# Patient Record
Sex: Female | Born: 1995 | Hispanic: Yes | Marital: Single | Smoking: Never smoker
Health system: Southern US, Community
[De-identification: ages and names within clinical notes are randomized; demographics above are authoritative.]

---

## 2016-12-17 ENCOUNTER — Encounter (HOSPITAL_COMMUNITY): Payer: Self-pay | Admitting: Emergency Medicine

## 2016-12-17 ENCOUNTER — Emergency Department (HOSPITAL_COMMUNITY)
Admission: EM | Admit: 2016-12-17 | Discharge: 2016-12-18 | Disposition: A | Discharge status: Home / Self Care | Payer: BC Managed Care – PPO | Attending: Emergency Medicine | Admitting: Emergency Medicine

## 2016-12-17 DIAGNOSIS — R509 Fever, unspecified: Secondary | ICD-10-CM | POA: Insufficient documentation

## 2016-12-17 DIAGNOSIS — R000 Tachycardia, unspecified: Secondary | ICD-10-CM | POA: Diagnosis not present

## 2016-12-17 DIAGNOSIS — R0981 Nasal congestion: Secondary | ICD-10-CM | POA: Diagnosis not present

## 2016-12-17 DIAGNOSIS — R590 Localized enlarged lymph nodes: Secondary | ICD-10-CM | POA: Insufficient documentation

## 2016-12-17 DIAGNOSIS — J36 Peritonsillar abscess: Secondary | ICD-10-CM | POA: Diagnosis not present

## 2016-12-17 DIAGNOSIS — R51 Headache: Secondary | ICD-10-CM | POA: Diagnosis not present

## 2016-12-17 DIAGNOSIS — J029 Acute pharyngitis, unspecified: Secondary | ICD-10-CM | POA: Diagnosis present

## 2016-12-17 MED ORDER — DEXAMETHASONE SODIUM PHOSPHATE 10 MG/ML IJ SOLN
10.0000 mg | Freq: Once | INTRAMUSCULAR | Status: AC
  Administered 2016-12-18: 10 mg via INTRAVENOUS
  Filled 2016-12-17: qty 1

## 2016-12-17 MED ORDER — PENICILLIN G BENZATHINE 1200000 UNIT/2ML IM SUSP
1.2000 10*6.[IU] | Freq: Once | INTRAMUSCULAR | Status: AC
  Administered 2016-12-17: 1.2 10*6.[IU] via INTRAMUSCULAR
  Filled 2016-12-17: qty 2

## 2016-12-17 MED ORDER — LACTATED RINGERS IV BOLUS (SEPSIS)
1000.0000 mL | Freq: Once | INTRAVENOUS | Status: AC
  Administered 2016-12-18: 1000 mL via INTRAVENOUS

## 2016-12-17 MED ORDER — ACETAMINOPHEN-CODEINE 120-12 MG/5ML PO SOLN
5.0000 mL | Freq: Once | ORAL | Status: AC
  Administered 2016-12-17: 5 mL via ORAL

## 2016-12-17 MED ORDER — KETOROLAC TROMETHAMINE 15 MG/ML IJ SOLN
15.0000 mg | Freq: Once | INTRAMUSCULAR | Status: AC
  Administered 2016-12-18: 15 mg via INTRAVENOUS
  Filled 2016-12-17: qty 1

## 2016-12-17 MED ORDER — LIDOCAINE VISCOUS 2 % MT SOLN
15.0000 mL | Freq: Once | OROMUCOSAL | Status: AC
  Administered 2016-12-17: 15 mL via OROMUCOSAL
  Filled 2016-12-17: qty 15

## 2016-12-17 MED ORDER — DEXAMETHASONE 10 MG/ML FOR PEDIATRIC ORAL USE
10.0000 mg | Freq: Once | INTRAMUSCULAR | Status: DC
  Filled 2016-12-17 (×2): qty 1

## 2016-12-17 NOTE — ED Notes (Signed)
Patient will not open mouth for exam for NP because it is too painful.  Pt now acuity 3 per Damian Leavell and [place].

## 2016-12-17 NOTE — ED Notes (Signed)
This RN wasted 120-12 mg/107mL of acetaminophen-codeine with Marzetta Board, in sink.

## 2016-12-17 NOTE — ED Provider Notes (Signed)
[facility]-EMERGENCY DEPT Provider Note   CSN: [id number] Arrival date & time: 12/17/16  2009     History   Chief Complaint Chief Complaint  Patient presents with  . Sore Throat    HPI Michelle Li is a 21 y.o. female who presents to the ED with a sore throat. Patient reports that she was dx with strep throat and started on a Z-pak and continues to have the same pain.   HPI  History reviewed. No pertinent past medical history.  There are no active problems to display for this patient.   History reviewed. No pertinent surgical history.  OB History    No data available       Home Medications    Prior to Admission medications   Not on File    Family History No family history on file.  Social History Social History  Substance Use Topics  . Smoking status: Never Smoker  . Smokeless tobacco: Never Used  . Alcohol use No     Allergies   Patient has no known allergies.   Review of Systems Review of Systems  Constitutional: Positive for chills and fever.  HENT: Positive for congestion, sore throat and trouble swallowing.   Respiratory: Negative for cough.   Gastrointestinal:       Nausea and vomiting when trying to take the Zithromax that was prescribed by [facility].  Musculoskeletal: Positive for neck pain.  Skin: Negative for rash.  Neurological: Positive for headaches. Negative for syncope.  Hematological: Positive for adenopathy.     Physical Exam Updated Vital Signs BP 132/76 (BP Location: Left Arm)   Pulse (!) 106   Temp 98.2 F (36.8 C) (Oral)   Resp 16   Ht  (1.549 m)   Wt 54.9 kg (121 lb)   LMP 12/02/2016 (Approximate)   SpO2 97%   BMI 22.86 kg/m   Physical Exam  Constitutional: She is oriented to person, place, and time. She appears well-developed and well-nourished. No distress.  Patient spitting into a cup stating it is to hard to swallow.  HENT:  Head: Normocephalic.  Right Ear: Tympanic membrane normal.  Left Ear:  Tympanic membrane normal.  Nose: Nose normal.  Mouth/Throat: Uvula is midline. Posterior oropharyngeal edema and posterior oropharyngeal erythema present.  Tonsils enlarged, difficulty visualizing the throat due to patient's pain and stating she can not open her mouth.   Eyes: EOM are normal.  Neck: Neck supple.  Cardiovascular: Regular rhythm.  Tachycardia present.   Pulmonary/Chest: Effort normal.  Musculoskeletal: Normal range of motion.  Lymphadenopathy:    She has cervical adenopathy.  Neurological: She is alert and oriented to person, place, and time. No cranial nerve deficit.  Skin: Skin is warm and dry.  Psychiatric: She has a normal mood and affect.  Nursing note and vitals reviewed.    ED Treatments / Results  Labs (all labs ordered are listed, but only abnormal results are displayed) Labs Reviewed  CBC WITH DIFFERENTIAL/PLATELET - Abnormal; Notable for the following:       Result Value   WBC 22.2 (*)    RBC 5.20 (*)    Hemoglobin 15.1 (*)    Neutro Abs 17.3 (*)    Monocytes Absolute 2.6 (*)    All other components within normal limits  I-STAT CHEM 8, ED - Abnormal; Notable for the following:    Glucose, Bld 110 (*)    Hemoglobin 16.0 (*)    HCT 47.0 (*)    All other components  within normal limits  I-STAT BETA HCG BLOOD, ED ([facility], [facility], [facility] ONLY)   Radiology Ct Soft Tissue Neck W Contrast  Result Date: 12/18/2016 CLINICAL DATA:  Initial evaluation for acute sore throat, stridor. EXAM: CT NECK WITH CONTRAST TECHNIQUE: Multidetector CT imaging of the neck was performed using the standard protocol following the bolus administration of intravenous contrast. CONTRAST:  75mL ISOVUE-300 IOPAMIDOL (ISOVUE-300) INJECTION 61% COMPARISON:  None available. FINDINGS: Pharynx and larynx: Oral cavity within normal limits without mass lesion or loculated fluid collection. No acute abnormality about the dentition. Palatine tonsils are enlarged and hyperenhancing bilaterally, right  greater than left, consistent with acute tonsillitis. There is an irregular multi loculated collection at the level the right tonsil that measures 2.4 x 2.3 x 3.4 cm, consistent with tonsillar/ peritonsillar abscess (series 3, image 35). Additional curvilinear hypodense collection at the lateral margin of the left tonsil measures 1.4 x 0.6 x 1.3 cm, also consistent with tonsillar/ peritonsillar abscess (series 3, image 39). Associated edema within the adjacent oropharyngeal mucosa of. Induration within the right parapharyngeal fat. Nasopharynx within normal limits. Trace retropharyngeal effusion, likely reactive. No retropharyngeal abscess. Epiglottis within normal limits without evidence for acute epiglottitis. Vallecula clear. Remainder of the hypopharynx and supraglottic larynx within normal limits. True cords symmetric and normal. Subglottic airway clear. Salivary glands: Salivary glands including the parotid and submandibular glands are within normal limits. Thyroid: Thyroid normal. Lymph nodes: No pathologically enlarged lymph nodes identified within the neck. Vascular: Normal intravascular enhancement seen throughout the neck. Limited intracranial: Unremarkable. Visualized orbits: Visualized globes and orbital soft tissues within normal limits. Mastoids and visualized paranasal sinuses: Chronic right maxillary sinusitis. Minimal mucosal thickening noted within the inferior left maxillary sinus is well. Visualized paranasal sinuses otherwise clear. Mastoid air cells and middle ear cavities are clear. Skeleton: No acute osseous abnormality. No worrisome lytic or blastic osseous lesions. Upper chest: Visualized upper chest within normal limits. Visualized lungs are clear. Other: None. IMPRESSION: 1. Findings consistent with acute tonsillitis with associated bilateral tonsillar/peritonsillar abscesses as above. Collection on the right measures 2.4 x 2.3 x 3.4 cm. Collection on the left measures 1.4 x 0.6 x 1.3  cm. 2. No evidence for associated or acute epiglottitis. Electronically Signed   By: Rise Mu M.D.   On: 12/18/2016 02:11   Dr. Donnald Garre in to see the patient and discuss results of CT scan and plan.  Dr. Donnald Garre will drain the tonsillar abscess.  Patient to be moved to Pod A for procedure.   Procedures Procedures (including critical care time)  Medications Ordered in ED Medications  acetaminophen-codeine 120-12 MG/5ML solution 5 mL (5 mLs Oral Given 12/17/16 2218)  lidocaine (XYLOCAINE) 2 % viscous mouth solution 15 mL (15 mLs Mouth/Throat Given 12/17/16 2217)  penicillin g benzathine (BICILLIN LA) 1200000 UNIT/2ML injection 1.2 Million Units (1.2 Million Units Intramuscular Given 12/17/16 2218)  dexamethasone (DECADRON) injection 10 mg (10 mg Intravenous Given 12/18/16 0017)  lactated ringers bolus 1,000 mL (1,000 mLs Intravenous New Bag/Given 12/18/16 0024)  ketorolac (TORADOL) 15 MG/ML injection 15 mg (15 mg Intravenous Given 12/18/16 0021)  iopamidol (ISOVUE-300) 61 % injection 75 mL (75 mLs Intravenous Contrast Given 12/18/16 0140)     Initial Impression / Assessment and Plan / ED Course  I have reviewed the triage vital signs and the nursing notes.  Final Clinical Impressions(s) / ED Diagnoses   Final diagnoses:  Tonsillar abscess   Care turned over to Dr. Donnald Garre @ 2:30 am  New Prescriptions New  Prescriptions   No medications on file     Kerrie [place], [place] 12/18/16 [id number]    Arby Barrette, MD 12/25/16 [phone]

## 2016-12-17 NOTE — ED Triage Notes (Signed)
Patient reports persistent sore throat onset this week diagnosed with strep throat at school currently taking Azithromycin with no improvement , denies fever or chills .

## 2016-12-18 ENCOUNTER — Emergency Department (HOSPITAL_COMMUNITY): Payer: BC Managed Care – PPO

## 2016-12-18 LAB — CBC WITH DIFFERENTIAL/PLATELET
BASOS ABS: 0 10*3/uL (ref 0.0–0.1)
Basophils Relative: 0 %
EOS ABS: 0 10*3/uL (ref 0.0–0.7)
EOS PCT: 0 %
HCT: 44.8 % (ref 36.0–46.0)
Hemoglobin: 15.1 g/dL — ABNORMAL HIGH (ref 12.0–15.0)
LYMPHS PCT: 10 %
Lymphs Abs: 2.3 10*3/uL (ref 0.7–4.0)
MCH: 29 pg (ref 26.0–34.0)
MCHC: 33.7 g/dL (ref 30.0–36.0)
MCV: 86.2 fL (ref 78.0–100.0)
Monocytes Absolute: 2.6 10*3/uL — ABNORMAL HIGH (ref 0.1–1.0)
Monocytes Relative: 12 %
Neutro Abs: 17.3 10*3/uL — ABNORMAL HIGH (ref 1.7–7.7)
Neutrophils Relative %: 78 %
PLATELETS: 327 10*3/uL (ref 150–400)
RBC: 5.2 MIL/uL — AB (ref 3.87–5.11)
RDW: 14.1 % (ref 11.5–15.5)
WBC: 22.2 10*3/uL — AB (ref 4.0–10.5)

## 2016-12-18 LAB — I-STAT CHEM 8, ED
BUN: 11 mg/dL (ref 6–20)
Calcium, Ion: 1.18 mmol/L (ref 1.15–1.40)
Chloride: 107 mmol/L (ref 101–111)
Creatinine, Ser: 0.7 mg/dL (ref 0.44–1.00)
Glucose, Bld: 110 mg/dL — ABNORMAL HIGH (ref 65–99)
HEMATOCRIT: 47 % — AB (ref 36.0–46.0)
HEMOGLOBIN: 16 g/dL — AB (ref 12.0–15.0)
POTASSIUM: 3.6 mmol/L (ref 3.5–5.1)
SODIUM: 144 mmol/L (ref 135–145)
TCO2: 23 mmol/L (ref 22–32)

## 2016-12-18 LAB — I-STAT BETA HCG BLOOD, ED (MC, WL, AP ONLY): I-stat hCG, quantitative: <5 m[IU]/mL (ref <5)

## 2016-12-18 MED ORDER — IBUPROFEN 100 MG/5ML PO SUSP: 10.0000 mg/kg | Freq: Four times a day (QID) | ORAL | 0 refills | Status: AC | PRN

## 2016-12-18 MED ORDER — IBUPROFEN 100 MG/5ML PO SUSP
600.0000 mg | Freq: Once | ORAL | Status: AC
  Administered 2016-12-18: 600 mg via ORAL
  Filled 2016-12-18: qty 30

## 2016-12-18 MED ORDER — IOPAMIDOL (ISOVUE-300) INJECTION 61%
75.0000 mL | Freq: Once | INTRAVENOUS | Status: AC | PRN
  Administered 2016-12-18: 75 mL via INTRAVENOUS

## 2016-12-18 MED ORDER — LIDOCAINE HCL (PF) 1 % IJ SOLN
5.0000 mL | Freq: Once | INTRAMUSCULAR | Status: AC
  Administered 2016-12-18: 5 mL
  Filled 2016-12-18: qty 5

## 2016-12-18 MED ORDER — AMOXICILLIN-POT CLAVULANATE 400-57 MG/5ML PO SUSR: 400.0000 mg | Freq: Three times a day (TID) | ORAL | 0 refills | Status: AC

## 2016-12-18 MED ORDER — BENZOCAINE 20 % MT AERO
INHALATION_SPRAY | Freq: Once | OROMUCOSAL | Status: AC
  Administered 2016-12-18: 03:00:00 via OROMUCOSAL
  Filled 2016-12-18: qty 57

## 2016-12-18 NOTE — ED Notes (Signed)
Patient transported to CT 

## 2016-12-21 LAB — AEROBIC CULTURE  (SUPERFICIAL SPECIMEN)

## 2016-12-21 LAB — AEROBIC CULTURE W GRAM STAIN (SUPERFICIAL SPECIMEN)

## 2016-12-22 ENCOUNTER — Telehealth: Payer: Self-pay | Admitting: Emergency Medicine

## 2016-12-22 NOTE — Telephone Encounter (Signed)
Post ED Visit - Positive Culture Follow-up  Culture report reviewed by antimicrobial stewardship pharmacist:   Enzo Bi, Pharm.D.  Celedonio Miyamoto, Pharm.D., BCPS AQ-ID  Garvin Fila, Pharm.D., BCPS  Georgina Pillion, Pharm.D., BCPS  [place], [address].D., BCPS, AAHIVP  Estella Husk, Pharm.D., BCPS, AAHIVP  Lysle Pearl, PharmD, BCPS  Casilda Carls, PharmD, BCPS  Pollyann Samples, PharmD, BCPS  Positive urine culture Treated with azithromycin and amoxicillin, organism sensitive to the same and no further patient follow-up is required at this time.  Berle Mull 12/22/2016, 11:49 AM

## 2018-10-11 IMAGING — CT CT NECK W/ CM
3 of 4 series · 13 of 33 positions shown, 16 images · IV contrast (Omni 300)
Comparison: None available.

CLINICAL DATA: Initial evaluation for acute sore throat, stridor.

EXAM:
CT NECK WITH CONTRAST
TECHNIQUE: Multidetector CT imaging of the neck was performed using the
standard protocol following the bolus administration of intravenous
contrast.
CONTRAST:  75mL Q8DB5C-HEE IOPAMIDOL (Q8DB5C-HEE) INJECTION 61%

[Series 3: neck 2.0 st · axial · 0.50mm/px · z∈[-312,-168]mm · 5 of 109 slices shown, 7 images (1 of 3)]
[im 19/109  soft-tissue]
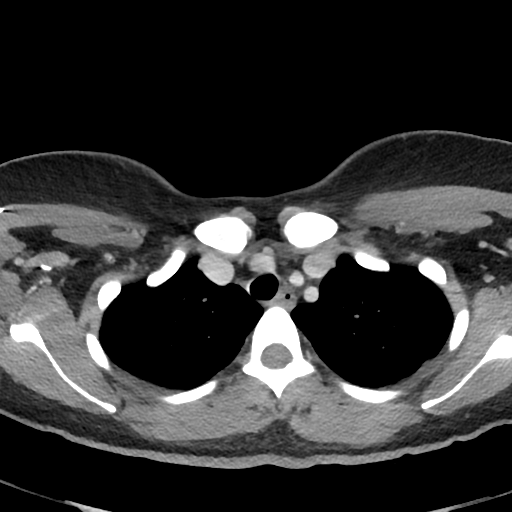
[im 19/109  bone]
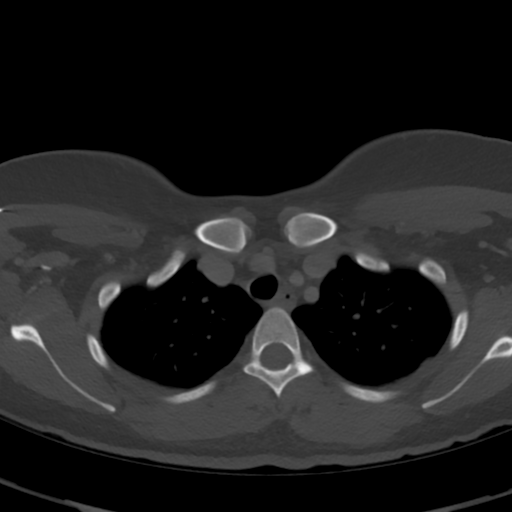
[im 37/109  bone]
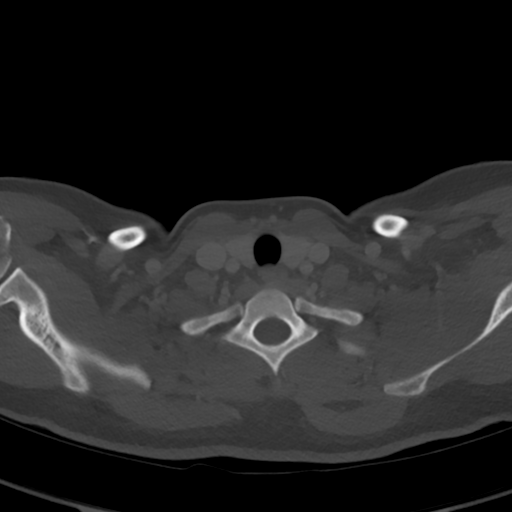
[im 55/109  bone]
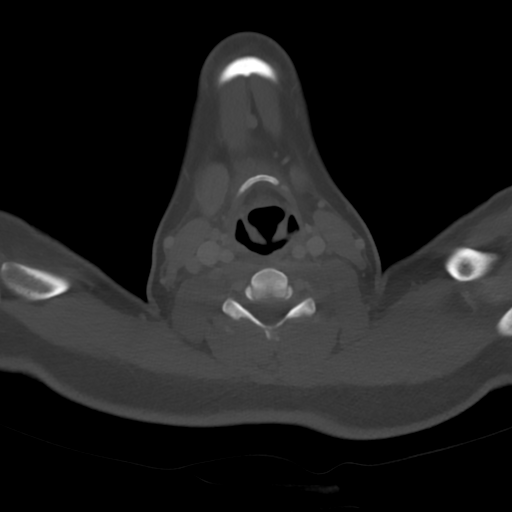
[im 73/109  bone]
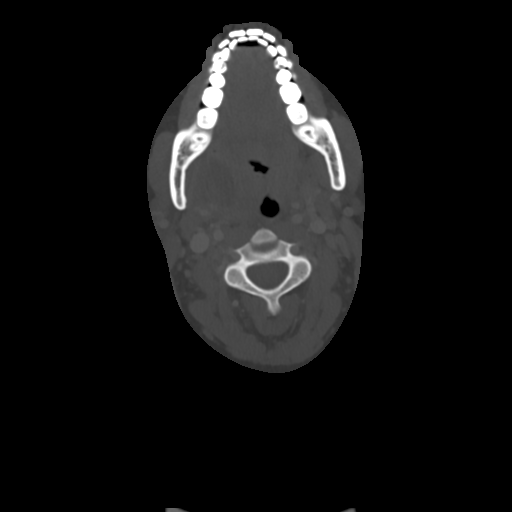
[im 91/109  soft-tissue]
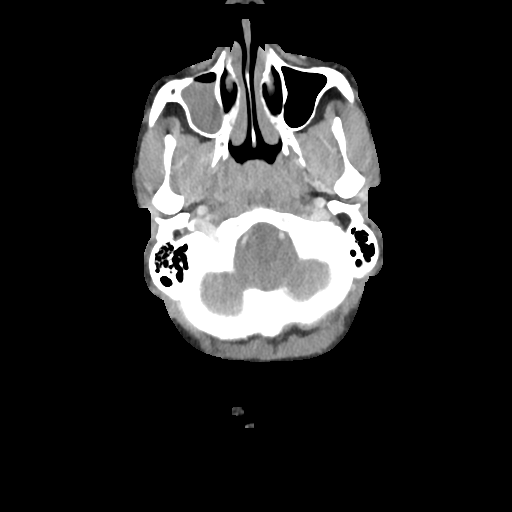
[im 91/109  bone]
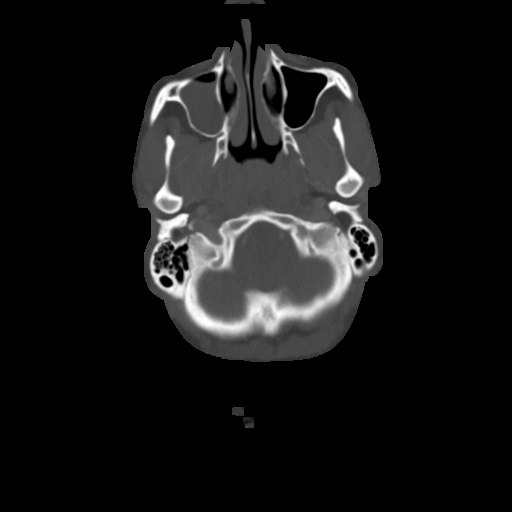

[Series 5: neck 2.0 st · sagittal · 0.43mm/px · 5 of 101 slices shown, 6 images (2 of 3)]
[im 34/101  bone]
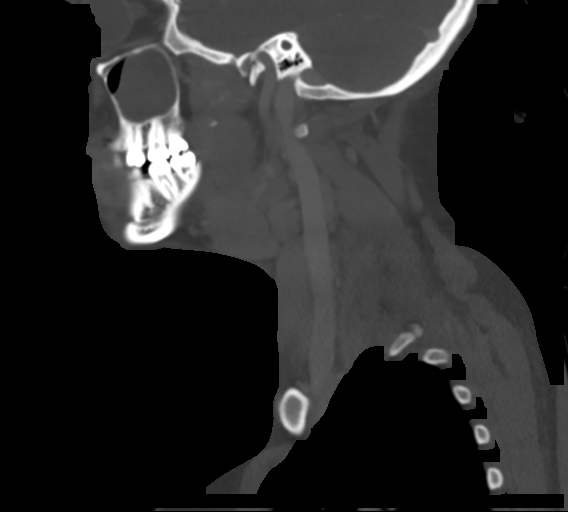
[im 42/101  bone]
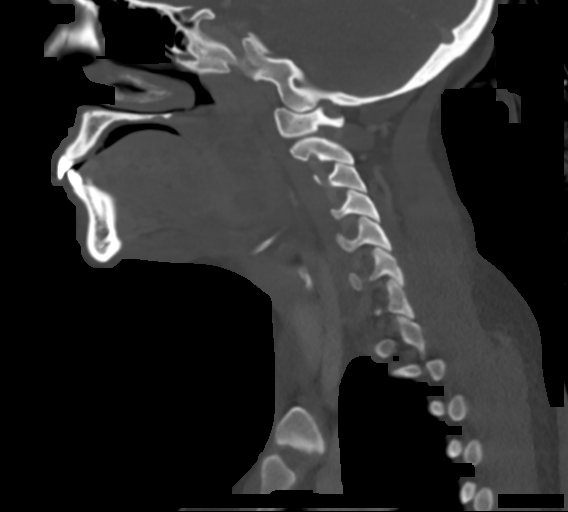
[im 51/101  soft-tissue]
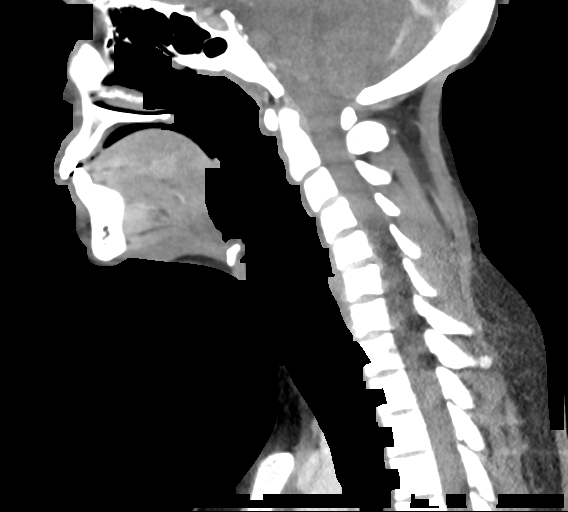
[im 51/101  bone]
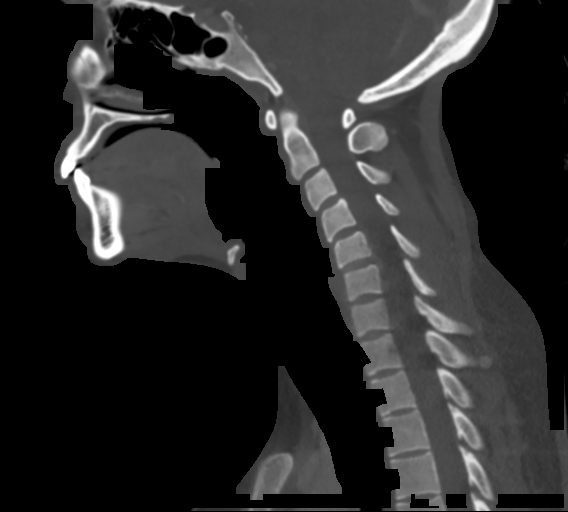
[im 59/101  bone]
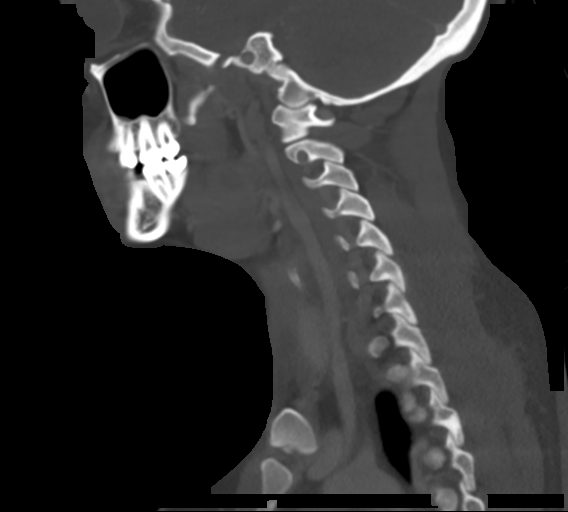
[im 67/101  bone]
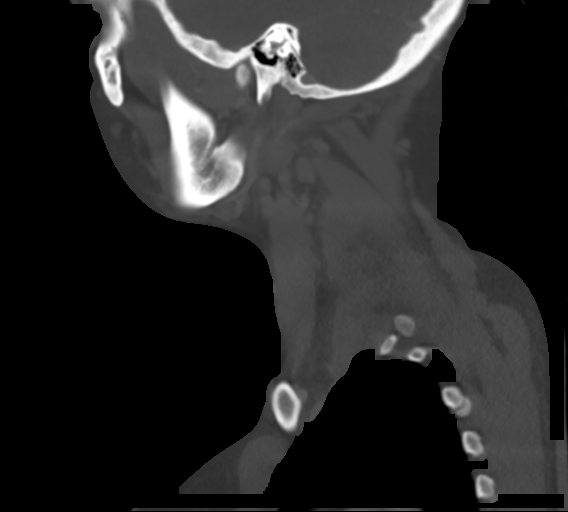

[Series 6: neck 2.0 st · coronal · 0.43mm/px · 3 of 101 slices shown (3 of 3)]
[im 21/101  bone]
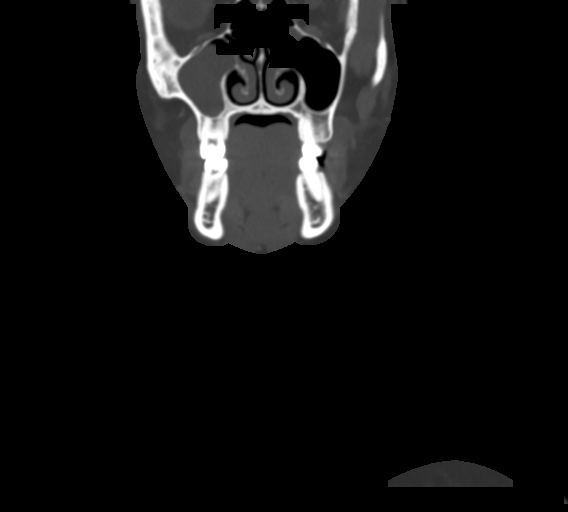
[im 41/101  bone]
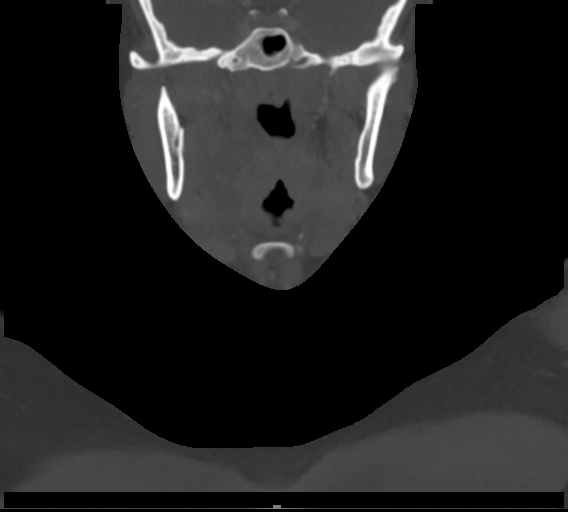
[im 61/101  bone]
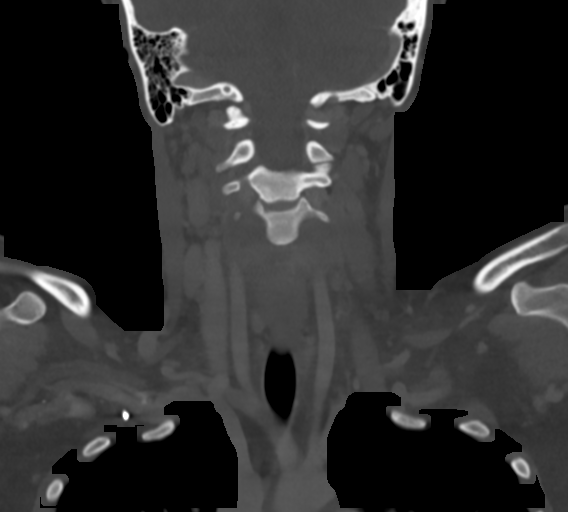

[13 of 33 positions shown; findings below may reference images not displayed]

FINDINGS: Pharynx and larynx: Oral cavity within normal limits without mass
lesion or loculated fluid collection. No acute abnormality about the
dentition.

Palatine tonsils are enlarged and hyperenhancing bilaterally, right
greater than left, consistent with acute tonsillitis. There is an
irregular multi loculated collection at the level the right tonsil
that measures 2.4 x 2.3 x 3.4 cm, consistent with tonsillar/
peritonsillar abscess (series 3, image 35). Additional curvilinear
hypodense collection at the lateral margin of the left tonsil
measures 1.4 x 0.6 x 1.3 cm, also consistent with tonsillar/
peritonsillar abscess (series 3, image 39). Associated edema within
the adjacent oropharyngeal mucosa of. Induration within the right
parapharyngeal fat. Nasopharynx within normal limits. Trace
retropharyngeal effusion, likely reactive. No retropharyngeal
abscess. Epiglottis within normal limits without evidence for acute
epiglottitis. Vallecula clear. Remainder of the hypopharynx and
supraglottic larynx within normal limits. True cords symmetric and
normal. Subglottic airway clear.

Salivary glands: Salivary glands including the parotid and
submandibular glands are within normal limits.

Thyroid: Thyroid normal.

Lymph nodes: No pathologically enlarged lymph nodes identified
within the neck.

Vascular: Normal intravascular enhancement seen throughout the neck.

Limited intracranial: Unremarkable.

Visualized orbits: Visualized globes and orbital soft tissues within
normal limits.

Mastoids and visualized paranasal sinuses: Chronic right maxillary
sinusitis. Minimal mucosal thickening noted within the inferior left
maxillary sinus is well. Visualized paranasal sinuses otherwise
clear. Mastoid air cells and middle ear cavities are clear.

Skeleton: No acute osseous abnormality. No worrisome lytic or
blastic osseous lesions.

Upper chest: Visualized upper chest within normal limits. Visualized
lungs are clear.

Other: None.
IMPRESSION: 1. Findings consistent with acute tonsillitis with associated
bilateral tonsillar/peritonsillar abscesses as above. Collection on
the right measures 2.4 x 2.3 x 3.4 cm. Collection on the left
measures 1.4 x 0.6 x 1.3 cm.
2. No evidence for associated or acute epiglottitis.
